# Patient Record
Sex: Female | Born: 1960 | Race: White | Hispanic: No | Marital: Single | Smoking: Never smoker
Health system: Southern US, Community
[De-identification: ages and names within clinical notes are randomized; demographics above are authoritative.]

## PROBLEM LIST (undated history)

## (undated) HISTORY — PX: ABDOMINAL HYSTERECTOMY: SHX81

---

## 1998-08-09 ENCOUNTER — Other Ambulatory Visit: Admission: RE | Admit: 1998-08-09 | Discharge: 1998-08-09 | Payer: Self-pay | Admitting: Obstetrics and Gynecology

## 1998-09-04 ENCOUNTER — Other Ambulatory Visit: Admission: RE | Admit: 1998-09-04 | Discharge: 1998-09-04 | Payer: Self-pay | Admitting: Obstetrics and Gynecology

## 1998-10-11 ENCOUNTER — Other Ambulatory Visit: Admission: RE | Admit: 1998-10-11 | Discharge: 1998-10-11 | Payer: Self-pay | Admitting: *Deleted

## 1999-02-05 ENCOUNTER — Other Ambulatory Visit: Admission: RE | Admit: 1999-02-05 | Discharge: 1999-02-05 | Payer: Self-pay | Admitting: Obstetrics and Gynecology

## 1999-06-23 ENCOUNTER — Encounter (INDEPENDENT_AMBULATORY_CARE_PROVIDER_SITE_OTHER): Payer: Self-pay

## 1999-06-24 ENCOUNTER — Inpatient Hospital Stay (HOSPITAL_COMMUNITY): Admission: EM | Admit: 1999-06-24 | Discharge: 1999-06-25 | Payer: Self-pay | Admitting: Obstetrics and Gynecology

## 2002-06-22 ENCOUNTER — Other Ambulatory Visit: Admission: RE | Admit: 2002-06-22 | Discharge: 2002-06-22 | Payer: Self-pay | Admitting: Obstetrics and Gynecology

## 2004-03-27 ENCOUNTER — Other Ambulatory Visit: Admission: RE | Admit: 2004-03-27 | Discharge: 2004-03-27 | Payer: Self-pay | Admitting: Obstetrics and Gynecology

## 2004-04-01 ENCOUNTER — Encounter: Admission: RE | Admit: 2004-04-01 | Discharge: 2004-04-01 | Payer: Self-pay | Admitting: Obstetrics and Gynecology

## 2004-09-11 ENCOUNTER — Encounter: Admission: RE | Admit: 2004-09-11 | Discharge: 2004-09-11 | Payer: Self-pay | Admitting: Obstetrics and Gynecology

## 2004-11-11 ENCOUNTER — Encounter: Admission: RE | Admit: 2004-11-11 | Discharge: 2004-11-11 | Payer: Self-pay | Admitting: Family Medicine

## 2005-04-09 ENCOUNTER — Ambulatory Visit (HOSPITAL_COMMUNITY): Admission: RE | Admit: 2005-04-09 | Discharge: 2005-04-09 | Payer: Self-pay | Admitting: Family Medicine

## 2005-07-02 ENCOUNTER — Encounter: Admission: RE | Admit: 2005-07-02 | Discharge: 2005-07-02 | Payer: Self-pay | Admitting: Family Medicine

## 2007-01-06 ENCOUNTER — Encounter: Admission: RE | Admit: 2007-01-06 | Discharge: 2007-01-06 | Payer: Self-pay | Admitting: Family Medicine

## 2008-05-08 ENCOUNTER — Encounter: Admission: RE | Admit: 2008-05-08 | Discharge: 2008-05-08 | Payer: Self-pay | Admitting: Family Medicine

## 2008-10-03 ENCOUNTER — Encounter: Admission: RE | Admit: 2008-10-03 | Discharge: 2008-10-03 | Payer: Self-pay | Admitting: Family Medicine

## 2009-02-06 ENCOUNTER — Encounter: Admission: RE | Admit: 2009-02-06 | Discharge: 2009-02-06 | Payer: Self-pay | Admitting: Orthopedic Surgery

## 2009-06-06 ENCOUNTER — Encounter: Admission: RE | Admit: 2009-06-06 | Discharge: 2009-06-06 | Payer: Self-pay | Admitting: Internal Medicine

## 2009-06-18 ENCOUNTER — Encounter: Admission: RE | Admit: 2009-06-18 | Discharge: 2009-06-18 | Payer: Self-pay | Admitting: Internal Medicine

## 2010-02-11 ENCOUNTER — Encounter: Admission: RE | Admit: 2010-02-11 | Discharge: 2010-02-11 | Payer: Self-pay | Admitting: Otolaryngology

## 2010-10-12 ENCOUNTER — Encounter: Payer: Self-pay | Admitting: Family Medicine

## 2011-06-18 ENCOUNTER — Other Ambulatory Visit: Payer: Self-pay | Admitting: Internal Medicine

## 2011-06-18 DIAGNOSIS — Z1231 Encounter for screening mammogram for malignant neoplasm of breast: Secondary | ICD-10-CM

## 2011-07-08 ENCOUNTER — Ambulatory Visit
Admission: RE | Admit: 2011-07-08 | Discharge: 2011-07-08 | Disposition: A | Discharge status: Home / Self Care | Payer: Self-pay | Source: Ambulatory Visit | Attending: Internal Medicine | Admitting: Internal Medicine

## 2011-07-08 DIAGNOSIS — Z1231 Encounter for screening mammogram for malignant neoplasm of breast: Secondary | ICD-10-CM

## 2013-04-07 ENCOUNTER — Other Ambulatory Visit: Payer: Self-pay

## 2013-04-07 DIAGNOSIS — Z1231 Encounter for screening mammogram for malignant neoplasm of breast: Secondary | ICD-10-CM

## 2013-04-24 ENCOUNTER — Ambulatory Visit
Admission: RE | Admit: 2013-04-24 | Discharge: 2013-04-24 | Disposition: A | Discharge status: Home / Self Care | Payer: BC Managed Care – PPO | Source: Ambulatory Visit

## 2013-04-24 DIAGNOSIS — Z1231 Encounter for screening mammogram for malignant neoplasm of breast: Secondary | ICD-10-CM

## 2013-08-27 ENCOUNTER — Emergency Department (HOSPITAL_COMMUNITY)
Admission: EM | Admit: 2013-08-27 | Discharge: 2013-08-27 | Disposition: A | Discharge status: Home / Self Care | Payer: BC Managed Care – PPO | Source: Home / Self Care | Attending: Family Medicine | Admitting: Family Medicine

## 2013-08-27 ENCOUNTER — Emergency Department (INDEPENDENT_AMBULATORY_CARE_PROVIDER_SITE_OTHER): Payer: BC Managed Care – PPO

## 2013-08-27 ENCOUNTER — Encounter (HOSPITAL_COMMUNITY): Payer: Self-pay | Admitting: Emergency Medicine

## 2013-08-27 DIAGNOSIS — R0789 Other chest pain: Secondary | ICD-10-CM

## 2013-08-27 NOTE — ED Notes (Signed)
Pt here with c/o sudden right ribcage pain radiating to back that started yesterday. No injury or strain noted Chest tightness reported with sx's Pt tried Gas-X

## 2013-08-27 NOTE — ED Provider Notes (Signed)
CSN: [id number]     Arrival date & time 08/27/13  1746 History   First MD Initiated Contact with Patient 08/27/13 1802     Chief Complaint  Patient presents with  . Chest Pain   (Consider location/radiation/quality/duration/timing/severity/associated sxs/prior Treatment) Patient is a 52 y.o. female presenting with chest pain. The history is provided by the patient.  Chest Pain Pain location:  R chest Pain quality: sharp and shooting   Pain quality: no pressure   Pain radiates to:  Mid back Pain radiates to the back: yes   Pain severity:  Mild Progression:  Unchanged Chronicity:  New Context: not eating, not lifting, no movement and not raising an arm   Associated symptoms: no abdominal pain, no anorexia, no cough, no fever, no nausea, no palpitations and no weakness     History reviewed. No pertinent past medical history. Past Surgical History  Procedure Laterality Date  . Abdominal hysterectomy     No family history on file. History  Substance Use Topics  . Smoking status: Never Smoker   . Smokeless tobacco: Not on file  . Alcohol Use: Yes     Comment: admits to drinking daily   OB History   Grav Para Term Preterm Abortions TAB SAB Ect Mult Living                 Review of Systems  Constitutional: Negative.  Negative for fever.  Respiratory: Negative for cough.   Cardiovascular: Positive for chest pain. Negative for palpitations and leg swelling.  Gastrointestinal: Negative.  Negative for nausea, abdominal pain and anorexia.  Neurological: Negative for weakness.  Hematological: Negative for adenopathy.    Allergies  Sulfur and Tetracyclines & related  Home Medications   Current Outpatient Rx  Name  Route  Sig  Dispense  Refill  . ALPRAZolam (XANAX) 0.5 MG tablet   Oral   Take 0.5 mg by mouth at bedtime as needed for anxiety.         Marland Kitchen zolpidem (AMBIEN) 10 MG tablet   Oral   Take 10 mg by mouth at bedtime as needed for sleep.          BP 120/60   Pulse 93  Temp(Src) 97.9 F (36.6 C) (Oral)  Wt 122 lb (55.339 kg)  SpO2 100% Physical Exam  Nursing note and vitals reviewed. Constitutional: She is oriented to person, place, and time. She appears well-developed and well-nourished.  Neck: Normal range of motion. Neck supple.  Cardiovascular: Regular rhythm, normal heart sounds and intact distal pulses.   Pulmonary/Chest: Effort normal and breath sounds normal. She exhibits no tenderness.  Abdominal: Soft. Bowel sounds are normal.  Lymphadenopathy:    She has no cervical adenopathy.  Neurological: She is alert and oriented to person, place, and time.  Skin: Skin is warm and dry.    ED Course  Procedures (including critical care time) Labs Review Labs Reviewed - No data to display Imaging Review No results found.  EKG Interpretation    Date/Time:    Ventricular Rate:    PR Interval:    QRS Duration:   QT Interval:    QTC Calculation:   R Axis:     Text Interpretation:              MDM  X-rays reviewed and report per radiologist.     Linna Hoff, MD 08/27/13 1840

## 2013-08-29 ENCOUNTER — Ambulatory Visit (HOSPITAL_COMMUNITY): Payer: BC Managed Care – PPO

## 2015-02-04 ENCOUNTER — Ambulatory Visit
Admission: RE | Admit: 2015-02-04 | Discharge: 2015-02-04 | Disposition: A | Discharge status: Home / Self Care | Payer: Disability Insurance | Source: Ambulatory Visit | Attending: Internal Medicine | Admitting: Internal Medicine

## 2015-02-04 ENCOUNTER — Ambulatory Visit
Admission: RE | Admit: 2015-02-04 | Discharge: 2015-02-04 | Disposition: A | Discharge status: Home / Self Care | Payer: Disability Insurance | Source: Ambulatory Visit | Attending: Advanced Practice Midwife | Admitting: Advanced Practice Midwife

## 2015-02-04 ENCOUNTER — Other Ambulatory Visit: Payer: Self-pay | Admitting: Internal Medicine

## 2015-02-04 DIAGNOSIS — M549 Dorsalgia, unspecified: Secondary | ICD-10-CM

## 2015-02-04 DIAGNOSIS — M25559 Pain in unspecified hip: Secondary | ICD-10-CM | POA: Insufficient documentation

## 2015-02-04 DIAGNOSIS — R200 Anesthesia of skin: Secondary | ICD-10-CM | POA: Insufficient documentation

## 2015-05-28 ENCOUNTER — Other Ambulatory Visit: Payer: Self-pay

## 2015-05-28 DIAGNOSIS — Z1231 Encounter for screening mammogram for malignant neoplasm of breast: Secondary | ICD-10-CM

## 2015-05-30 ENCOUNTER — Ambulatory Visit
Admission: RE | Admit: 2015-05-30 | Discharge: 2015-05-30 | Disposition: A | Discharge status: Home / Self Care | Payer: 59 | Source: Ambulatory Visit

## 2015-05-30 DIAGNOSIS — Z1231 Encounter for screening mammogram for malignant neoplasm of breast: Secondary | ICD-10-CM

## 2015-05-31 ENCOUNTER — Ambulatory Visit: Payer: Disability Insurance

## 2015-06-03 ENCOUNTER — Other Ambulatory Visit: Payer: Self-pay | Admitting: Internal Medicine

## 2015-06-03 DIAGNOSIS — R928 Other abnormal and inconclusive findings on diagnostic imaging of breast: Secondary | ICD-10-CM

## 2015-06-07 ENCOUNTER — Ambulatory Visit
Admission: RE | Admit: 2015-06-07 | Discharge: 2015-06-07 | Disposition: A | Discharge status: Home / Self Care | Payer: 59 | Source: Ambulatory Visit | Attending: Internal Medicine | Admitting: Internal Medicine

## 2015-06-07 DIAGNOSIS — R928 Other abnormal and inconclusive findings on diagnostic imaging of breast: Secondary | ICD-10-CM

## 2016-09-08 ENCOUNTER — Other Ambulatory Visit: Payer: Self-pay | Admitting: Internal Medicine

## 2016-09-08 DIAGNOSIS — Z1231 Encounter for screening mammogram for malignant neoplasm of breast: Secondary | ICD-10-CM

## 2017-07-19 ENCOUNTER — Other Ambulatory Visit: Payer: Self-pay | Admitting: Family Medicine

## 2017-07-19 DIAGNOSIS — Z139 Encounter for screening, unspecified: Secondary | ICD-10-CM

## 2017-08-11 ENCOUNTER — Ambulatory Visit: Payer: Disability Insurance

## 2017-09-03 ENCOUNTER — Ambulatory Visit
Admission: RE | Admit: 2017-09-03 | Discharge: 2017-09-03 | Disposition: A | Discharge status: Home / Self Care | Payer: BLUE CROSS/BLUE SHIELD | Source: Ambulatory Visit | Attending: Family Medicine | Admitting: Family Medicine

## 2017-09-03 DIAGNOSIS — Z139 Encounter for screening, unspecified: Secondary | ICD-10-CM

## 2020-01-02 ENCOUNTER — Other Ambulatory Visit: Payer: Self-pay | Admitting: Family Medicine

## 2020-01-02 DIAGNOSIS — Z1231 Encounter for screening mammogram for malignant neoplasm of breast: Secondary | ICD-10-CM

## 2020-02-01 LAB — EXTERNAL GENERIC LAB PROCEDURE: COLOGUARD: NEGATIVE

## 2020-02-14 ENCOUNTER — Other Ambulatory Visit: Payer: Self-pay

## 2020-02-14 ENCOUNTER — Ambulatory Visit
Admission: RE | Admit: 2020-02-14 | Discharge: 2020-02-14 | Disposition: A | Discharge status: Home / Self Care | Payer: Medicare PPO | Source: Ambulatory Visit | Attending: Family Medicine | Admitting: Family Medicine

## 2020-02-14 DIAGNOSIS — Z1231 Encounter for screening mammogram for malignant neoplasm of breast: Secondary | ICD-10-CM

## 2020-03-19 DIAGNOSIS — E7029 Other disorders of tyrosine metabolism: Secondary | ICD-10-CM | POA: Diagnosis not present

## 2020-03-19 DIAGNOSIS — D485 Neoplasm of uncertain behavior of skin: Secondary | ICD-10-CM | POA: Diagnosis not present

## 2020-03-19 DIAGNOSIS — L821 Other seborrheic keratosis: Secondary | ICD-10-CM | POA: Diagnosis not present

## 2020-03-19 DIAGNOSIS — D1801 Hemangioma of skin and subcutaneous tissue: Secondary | ICD-10-CM | POA: Diagnosis not present

## 2021-04-01 ENCOUNTER — Other Ambulatory Visit: Payer: Self-pay | Admitting: Family Medicine

## 2021-04-01 DIAGNOSIS — Z1231 Encounter for screening mammogram for malignant neoplasm of breast: Secondary | ICD-10-CM

## 2021-04-02 ENCOUNTER — Other Ambulatory Visit: Payer: Self-pay | Admitting: Family Medicine

## 2021-04-02 DIAGNOSIS — R918 Other nonspecific abnormal finding of lung field: Secondary | ICD-10-CM

## 2021-04-09 ENCOUNTER — Ambulatory Visit
Admission: RE | Admit: 2021-04-09 | Discharge: 2021-04-09 | Disposition: A | Discharge status: Home / Self Care | Payer: Medicare Other | Source: Ambulatory Visit | Attending: Family Medicine | Admitting: Family Medicine

## 2021-04-09 DIAGNOSIS — R918 Other nonspecific abnormal finding of lung field: Secondary | ICD-10-CM

## 2021-04-09 MED ORDER — IOPAMIDOL (ISOVUE-300) INJECTION 61%
75.0000 mL | Freq: Once | INTRAVENOUS | Status: AC | PRN
  Administered 2021-04-09: 75 mL via INTRAVENOUS

## 2021-05-23 ENCOUNTER — Ambulatory Visit
Admission: RE | Admit: 2021-05-23 | Discharge: 2021-05-23 | Disposition: A | Discharge status: Home / Self Care | Payer: Medicare Other | Source: Ambulatory Visit | Attending: Family Medicine | Admitting: Family Medicine

## 2021-05-23 ENCOUNTER — Other Ambulatory Visit: Payer: Self-pay

## 2021-05-23 DIAGNOSIS — Z1231 Encounter for screening mammogram for malignant neoplasm of breast: Secondary | ICD-10-CM

## 2021-11-01 IMAGING — MG MM DIGITAL SCREENING BILAT W/ TOMO AND CAD
8 series · 8 of 24 positions shown · non-contrast
Comparison: Previous exam(s).

CLINICAL DATA: Screening.

EXAM:
DIGITAL SCREENING BILATERAL MAMMOGRAM WITH TOMOSYNTHESIS AND CAD
TECHNIQUE: Bilateral screening digital craniocaudal and mediolateral oblique
mammograms were obtained. Bilateral screening digital breast
tomosynthesis was performed. The images were evaluated with
computer-aided detection.

[R CC synth-2D]
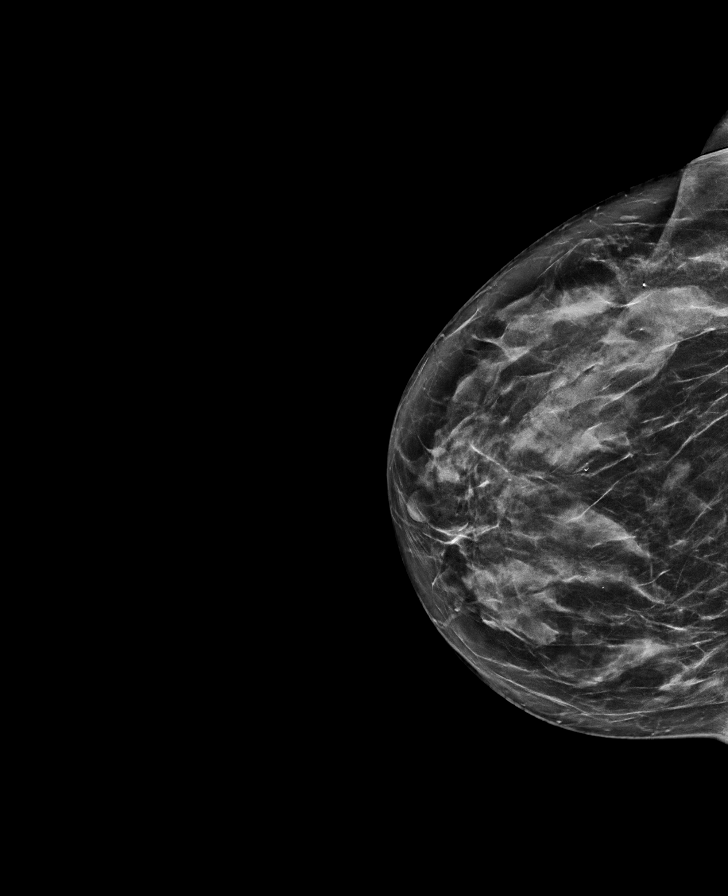

[R MLO synth-2D]
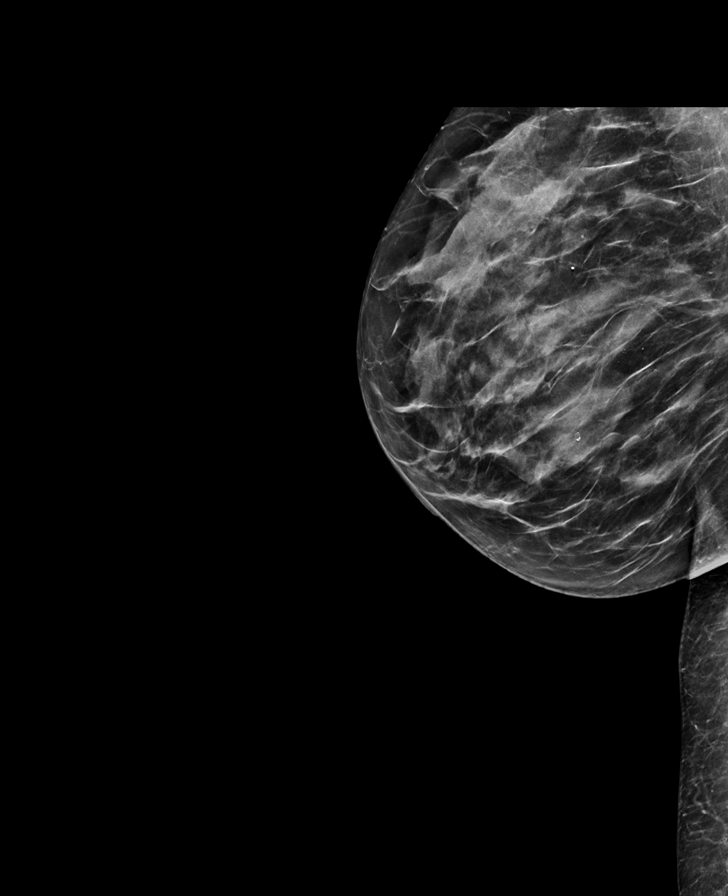

[L CC synth-2D]
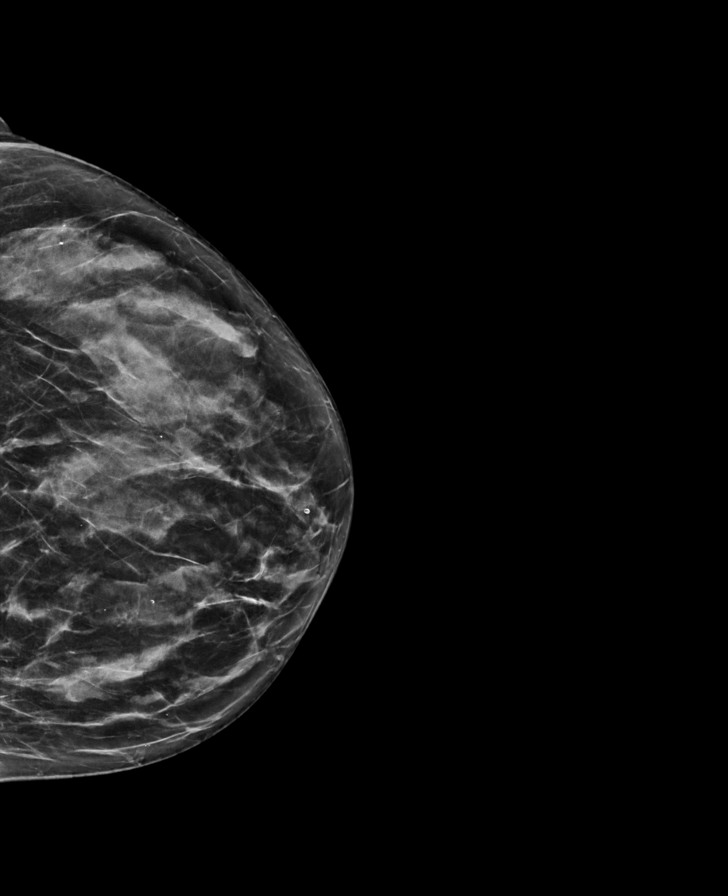

[L MLO synth-2D]
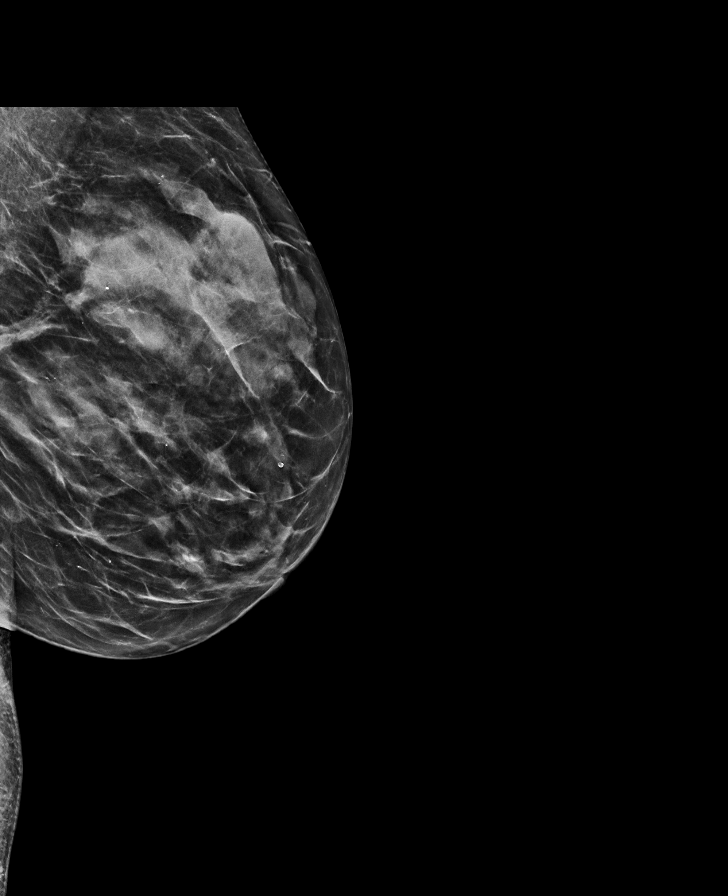

[L CC tomo · tomo slice 31/61.0]
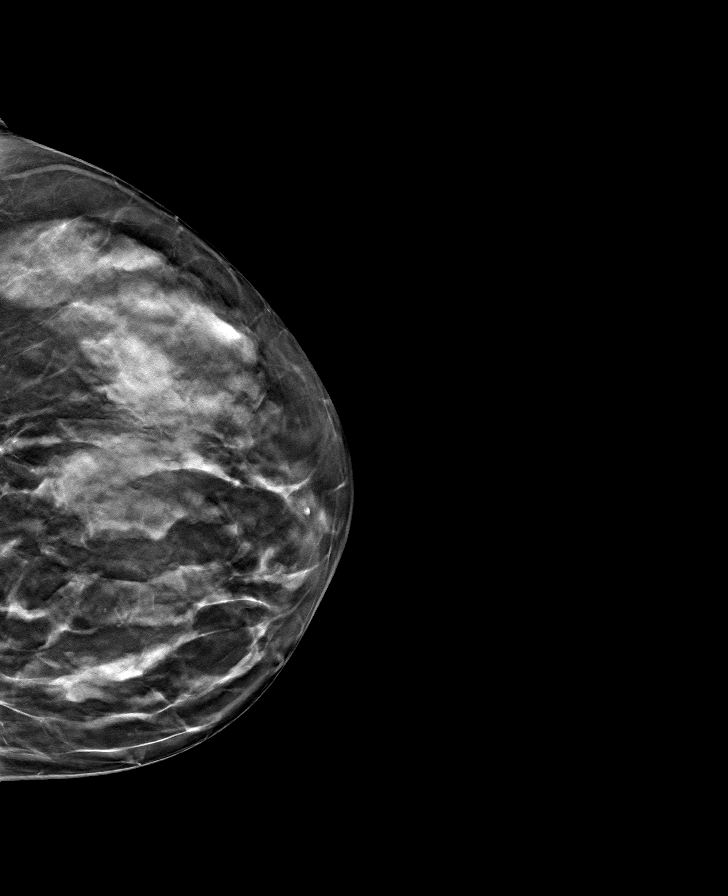

[L MLO tomo · tomo slice 31/60.0]
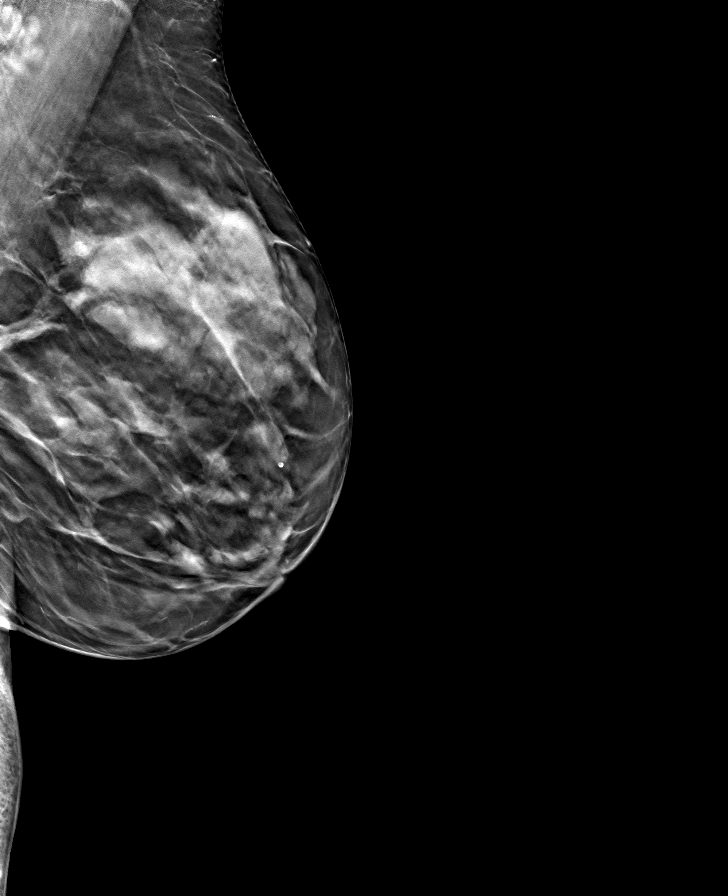

[R MLO tomo · tomo slice 30/59.0]
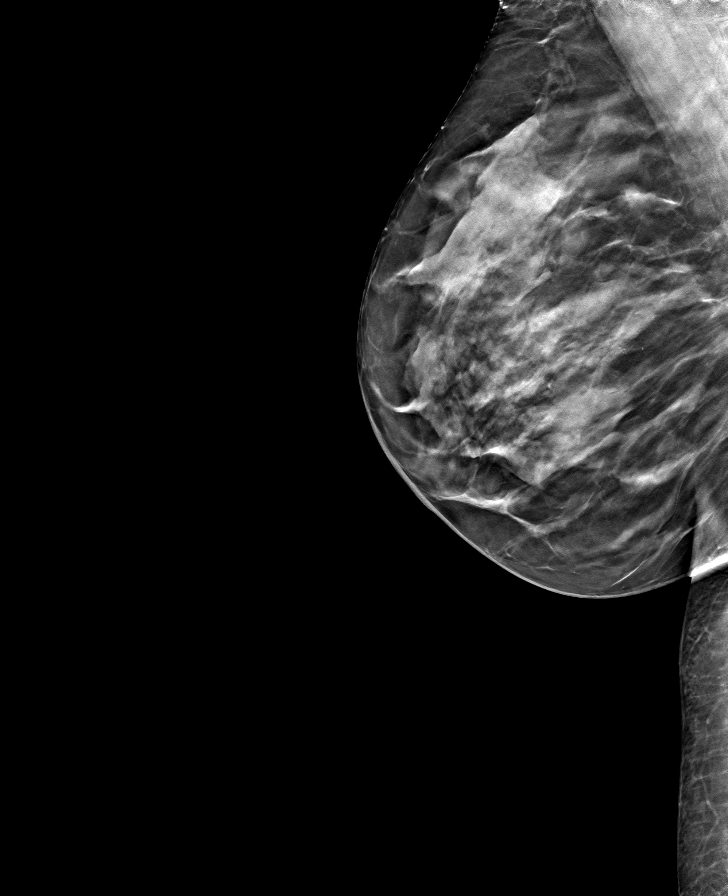

[R CC tomo · tomo slice 33/64.0]
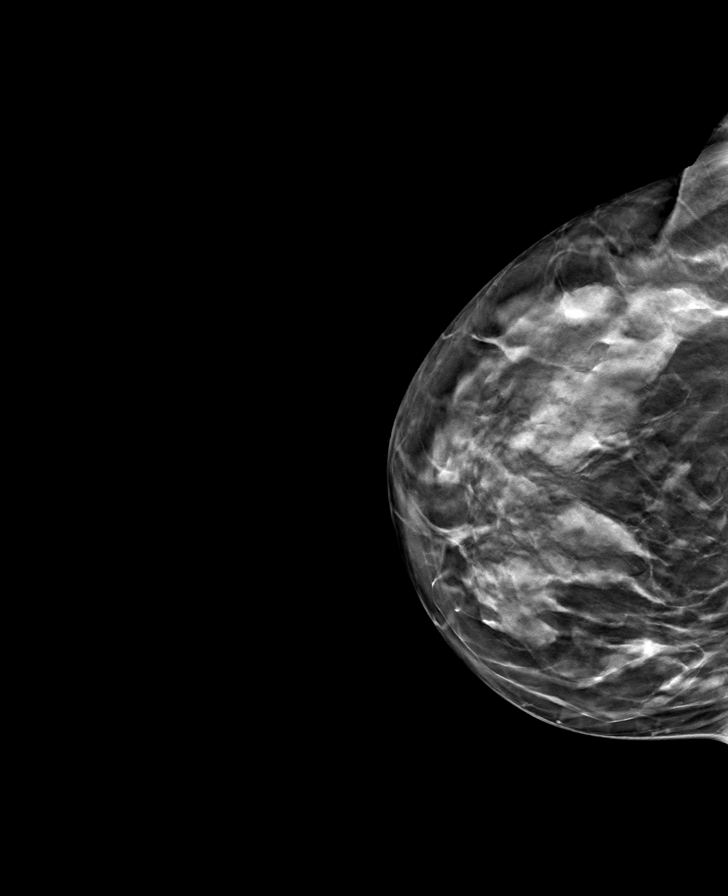

[8 of 24 positions shown; findings below may reference images not displayed]

ACR Breast Density Category c: The breast tissue is heterogeneously
dense, which may obscure small masses.
FINDINGS: There are no findings suspicious for malignancy.
IMPRESSION: No mammographic evidence of malignancy. A result letter of this
screening mammogram will be mailed directly to the patient.

RECOMMENDATION:
Screening mammogram in one year. (Code:Q3-W-BC3)

BI-RADS CATEGORY  1: Negative.

## 2022-01-18 ENCOUNTER — Other Ambulatory Visit: Payer: Self-pay

## 2022-01-18 ENCOUNTER — Emergency Department (HOSPITAL_COMMUNITY)
Admission: EM | Admit: 2022-01-18 | Discharge: 2022-01-18 | Disposition: A | Discharge status: Home / Self Care | Payer: Medicare Other | Attending: Emergency Medicine | Admitting: Emergency Medicine

## 2022-01-18 ENCOUNTER — Emergency Department (HOSPITAL_COMMUNITY): Payer: Medicare Other

## 2022-01-18 ENCOUNTER — Encounter (HOSPITAL_COMMUNITY): Payer: Self-pay | Admitting: Oncology

## 2022-01-18 DIAGNOSIS — R0602 Shortness of breath: Secondary | ICD-10-CM | POA: Diagnosis not present

## 2022-01-18 DIAGNOSIS — R61 Generalized hyperhidrosis: Secondary | ICD-10-CM | POA: Insufficient documentation

## 2022-01-18 DIAGNOSIS — R079 Chest pain, unspecified: Secondary | ICD-10-CM | POA: Insufficient documentation

## 2022-01-18 DIAGNOSIS — R0789 Other chest pain: Secondary | ICD-10-CM | POA: Diagnosis not present

## 2022-01-18 LAB — I-STAT BETA HCG BLOOD, ED (MC, WL, AP ONLY): I-stat hCG, quantitative: <5 m[IU]/mL (ref <5)

## 2022-01-18 LAB — CBC
HCT: 41.8 % (ref 36.0–46.0)
Hemoglobin: 14.2 g/dL (ref 12.0–15.0)
MCH: 30 pg (ref 26.0–34.0)
MCHC: 34 g/dL (ref 30.0–36.0)
MCV: 88.2 fL (ref 80.0–100.0)
Platelets: 252 10*3/uL (ref 150–400)
RBC: 4.74 MIL/uL (ref 3.87–5.11)
RDW: 12.4 % (ref 11.5–15.5)
WBC: 5.7 10*3/uL (ref 4.0–10.5)
nRBC: 0 % (ref 0.0–0.2)

## 2022-01-18 LAB — BASIC METABOLIC PANEL
Anion gap: 8 (ref 5–15)
BUN: 20 mg/dL (ref 6–20)
CO2: 26 mmol/L (ref 22–32)
Calcium: 9.6 mg/dL (ref 8.9–10.3)
Chloride: 104 mmol/L (ref 98–111)
Creatinine, Ser: 0.74 mg/dL (ref 0.44–1.00)
GFR, Estimated: >60 mL/min (ref >60)
Glucose, Bld: 105 mg/dL — ABNORMAL HIGH (ref 70–99)
Potassium: 3.8 mmol/L (ref 3.5–5.1)
Sodium: 138 mmol/L (ref 135–145)

## 2022-01-18 LAB — TROPONIN I (HIGH SENSITIVITY): Troponin I (High Sensitivity): 2 ng/L (ref <18)

## 2022-01-18 NOTE — ED Provider Notes (Signed)
?[facility] ?Provider Note ? ? ?CSN: [id number] ?Arrival date & time: 01/18/22  1652 ? ?  ? ?History ? ?Chief Complaint  ?Patient presents with  ? Chest Pain  ? ? ?Julie Gomez is a 61 y.o. female. ? ?Patient is a 61 yo female with PMH of alkaptonuria dx in 2014 presenting for chest pain. Pt admits to left sided chest pain that started this morning, non radiating, pressure like, constant, and associated with diaphoresis. States she has had intermittent chest pain over the last few months while resting. Was drinking coffee and having breakfast at tables when symptoms started. Pain lasted approximately 8 hours. Denies any sob, dib, or coughing. Admits to complete resolution of symptoms at this time.  ? ?No HTN, hyperlipidemia, nonsmoker, or DM.  ? ?The history is provided by the patient. No language interpreter was used.  ?Chest Pain ?Associated symptoms: no abdominal pain, no back pain, no cough, no fever, no palpitations, no shortness of breath and no vomiting   ? ?  ? ?Home Medications ?Prior to Admission medications   ?Medication Sig Start Date End Date Taking? Authorizing Provider  ?ALPRAZolam (XANAX) 0.5 MG tablet Take 0.5 mg by mouth at bedtime as needed for anxiety.    [provider]  ?zolpidem (AMBIEN) 10 MG tablet Take 10 mg by mouth at bedtime as needed for sleep.    [provider]  ?   ? ?Allergies    ?Elemental sulfur and Tetracyclines & related   ? ?Review of Systems   ?Review of Systems  ?Constitutional:  Negative for chills and fever.  ?HENT:  Negative for ear pain and sore throat.   ?Eyes:  Negative for pain and visual disturbance.  ?Respiratory:  Negative for cough and shortness of breath.   ?Cardiovascular:  Positive for chest pain. Negative for palpitations.  ?Gastrointestinal:  Negative for abdominal pain and vomiting.  ?Genitourinary:  Negative for dysuria and hematuria.  ?Musculoskeletal:  Negative for arthralgias and back pain.  ?Skin:   Negative for color change and rash.  ?Neurological:  Negative for seizures and syncope.  ?All other systems reviewed and are negative. ? ?Physical Exam ?Updated Vital Signs ?BP 126/83   Pulse 84   Temp 98.7 ?F (37.1 ?C) (Oral)   Resp 20   Ht '5\' 1"'$  (1.549 m)   Wt 60.8 kg   SpO2 97%   BMI 25.32 kg/m?  ?Physical Exam ?Vitals and nursing note reviewed.  ?Constitutional:   ?   General: She is not in acute distress. ?   Appearance: She is well-developed.  ?HENT:  ?   Head: Normocephalic and atraumatic.  ?Eyes:  ?   Conjunctiva/sclera: Conjunctivae normal.  ?Cardiovascular:  ?   Rate and Rhythm: Normal rate and regular rhythm.  ?   Heart sounds: No murmur heard. ?Pulmonary:  ?   Effort: Pulmonary effort is normal. No respiratory distress.  ?   Breath sounds: Normal breath sounds.  ?Abdominal:  ?   Palpations: Abdomen is soft.  ?   Tenderness: There is no abdominal tenderness.  ?Musculoskeletal:     ?   General: No swelling.  ?   Cervical back: Neck supple.  ?Skin: ?   General: Skin is warm and dry.  ?   Capillary Refill: Capillary refill takes less than 2 seconds.  ?Neurological:  ?   Mental Status: She is alert.  ?Psychiatric:     ?   Mood and Affect: Mood normal.  ? ? ?ED  Results / Procedures / Treatments   ?Labs ?(all labs ordered are listed, but only abnormal results are displayed) ?Labs Reviewed  ?BASIC METABOLIC PANEL - Abnormal; Notable for the following components:  ?    Result Value  ? Glucose, Bld 105 (*)   ? All other components within normal limits  ?CBC  ?I-STAT BETA HCG BLOOD, ED ([facility], [facility], [facility] ONLY)  ?TROPONIN I (HIGH SENSITIVITY)  ? ? ?EKG ?EKG Interpretation ? ?Date/Time:  Sunday January 18 2022 17:12:10 EDT ?Ventricular Rate:  82 ?PR Interval:  155 ?QRS Duration: 76 ?QT Interval:  364 ?QTC Calculation: 426 ?R Axis:   48 ?Text Interpretation: Sinus rhythm Low voltage, precordial leads Confirmed by Campbell Stall ([id number]) on 04/09/9469 6:25:08 PM ? ?Radiology ?DG Chest 2 View ? ?Result Date:  01/18/2022 ?CLINICAL DATA:  Shortness of breath and chest pain. EXAM: CHEST - 2 VIEW COMPARISON:  03/26/2021 FINDINGS: The heart size and mediastinal contours are within normal limits. Both lungs are clear. The visualized skeletal structures are unremarkable. IMPRESSION: No active cardiopulmonary disease. Electronically Signed   By: Kerby Moors M.D.   On: 01/18/2022 17:39   ? ?Procedures ?Procedures  ? ? ?Medications Ordered in ED ?Medications - No data to display ? ?ED Course/ Medical Decision Making/ A&P ?  ?                        ?Medical Decision Making ?Amount and/or Complexity of Data Reviewed ?Labs: ordered. ?Radiology: ordered. ? ? ?7:45 PM ? 61 yo female female presenting for chest pain. ? ?The patient's chest pain is not suggestive of pulmonary embolus, cardiac ischemia, aortic dissection, pericarditis, myocarditis, pulmonary embolism, pneumothorax, pneumonia, Zoster, or esophageal perforation, or other serious etiology.  Historically not abrupt in onset, tearing or ripping, pulses symmetric. EKG nonspecific for ischemia/infarction. No dysrhythmias, brugada, WPW, prolonged QT noted. [CXR reviewed and WNL] [Troponin negative x2. CXR reviewed. Labs without demonstration of acute pathology unless otherwise noted above. Low HEART Score: 0-3 points (0.9-1.7% risk of MACE).]  ? ?Given the extremely low risk of these diagnoses further testing and evaluation for these possibilities does not appear to be indicated at this time. Patient in no distress and overall condition improved here in the ED. Detailed discussions were had with the patient regarding current findings, and need for close f/u with PCP or on call doctor. The patient has been instructed to return immediately if the symptoms worsen in any way for re-evaluation. Patient verbalized understanding and is in agreement with current care plan. All questions answered prior to discharge.  ? ? ? ? ? ? ? ?Final Clinical Impression(s) / ED Diagnoses ?Final  diagnoses:  ?Chest pain, unspecified type  ? ? ?Rx / DC Orders ?ED Discharge Orders   ? ? None  ? ?  ? ? ?  ?Lianne Cure, DO ?96/28/36 1945 ? ?

## 2022-01-18 NOTE — ED Triage Notes (Signed)
Pt c/o left sided CP that began around 0930.  +Shob. Pt's speech is rapid and pressured.  ?

## 2022-01-28 DIAGNOSIS — M8580 Other specified disorders of bone density and structure, unspecified site: Secondary | ICD-10-CM | POA: Diagnosis not present

## 2022-01-28 DIAGNOSIS — R0789 Other chest pain: Secondary | ICD-10-CM | POA: Diagnosis not present

## 2022-01-28 DIAGNOSIS — Z0000 Encounter for general adult medical examination without abnormal findings: Secondary | ICD-10-CM | POA: Diagnosis not present

## 2022-01-28 DIAGNOSIS — E7020 Disorder of tyrosine metabolism, unspecified: Secondary | ICD-10-CM | POA: Diagnosis not present

## 2022-02-11 DIAGNOSIS — E7020 Disorder of tyrosine metabolism, unspecified: Secondary | ICD-10-CM | POA: Diagnosis not present

## 2022-02-11 DIAGNOSIS — E7029 Other disorders of tyrosine metabolism: Secondary | ICD-10-CM | POA: Diagnosis not present

## 2022-02-11 DIAGNOSIS — R0789 Other chest pain: Secondary | ICD-10-CM | POA: Diagnosis not present

## 2022-03-16 DIAGNOSIS — L814 Other melanin hyperpigmentation: Secondary | ICD-10-CM | POA: Diagnosis not present

## 2022-03-16 DIAGNOSIS — D1801 Hemangioma of skin and subcutaneous tissue: Secondary | ICD-10-CM | POA: Diagnosis not present

## 2022-03-16 DIAGNOSIS — I788 Other diseases of capillaries: Secondary | ICD-10-CM | POA: Diagnosis not present

## 2022-03-16 DIAGNOSIS — D225 Melanocytic nevi of trunk: Secondary | ICD-10-CM | POA: Diagnosis not present

## 2022-03-16 DIAGNOSIS — E7029 Other disorders of tyrosine metabolism: Secondary | ICD-10-CM | POA: Diagnosis not present

## 2022-06-29 IMAGING — CR DG CHEST 2V
2 series · 2 of 2 positions shown · non-contrast
Comparison: 03/26/2021

CLINICAL DATA: Shortness of breath and chest pain.

EXAM:
CHEST - 2 VIEW

[w chest pa]
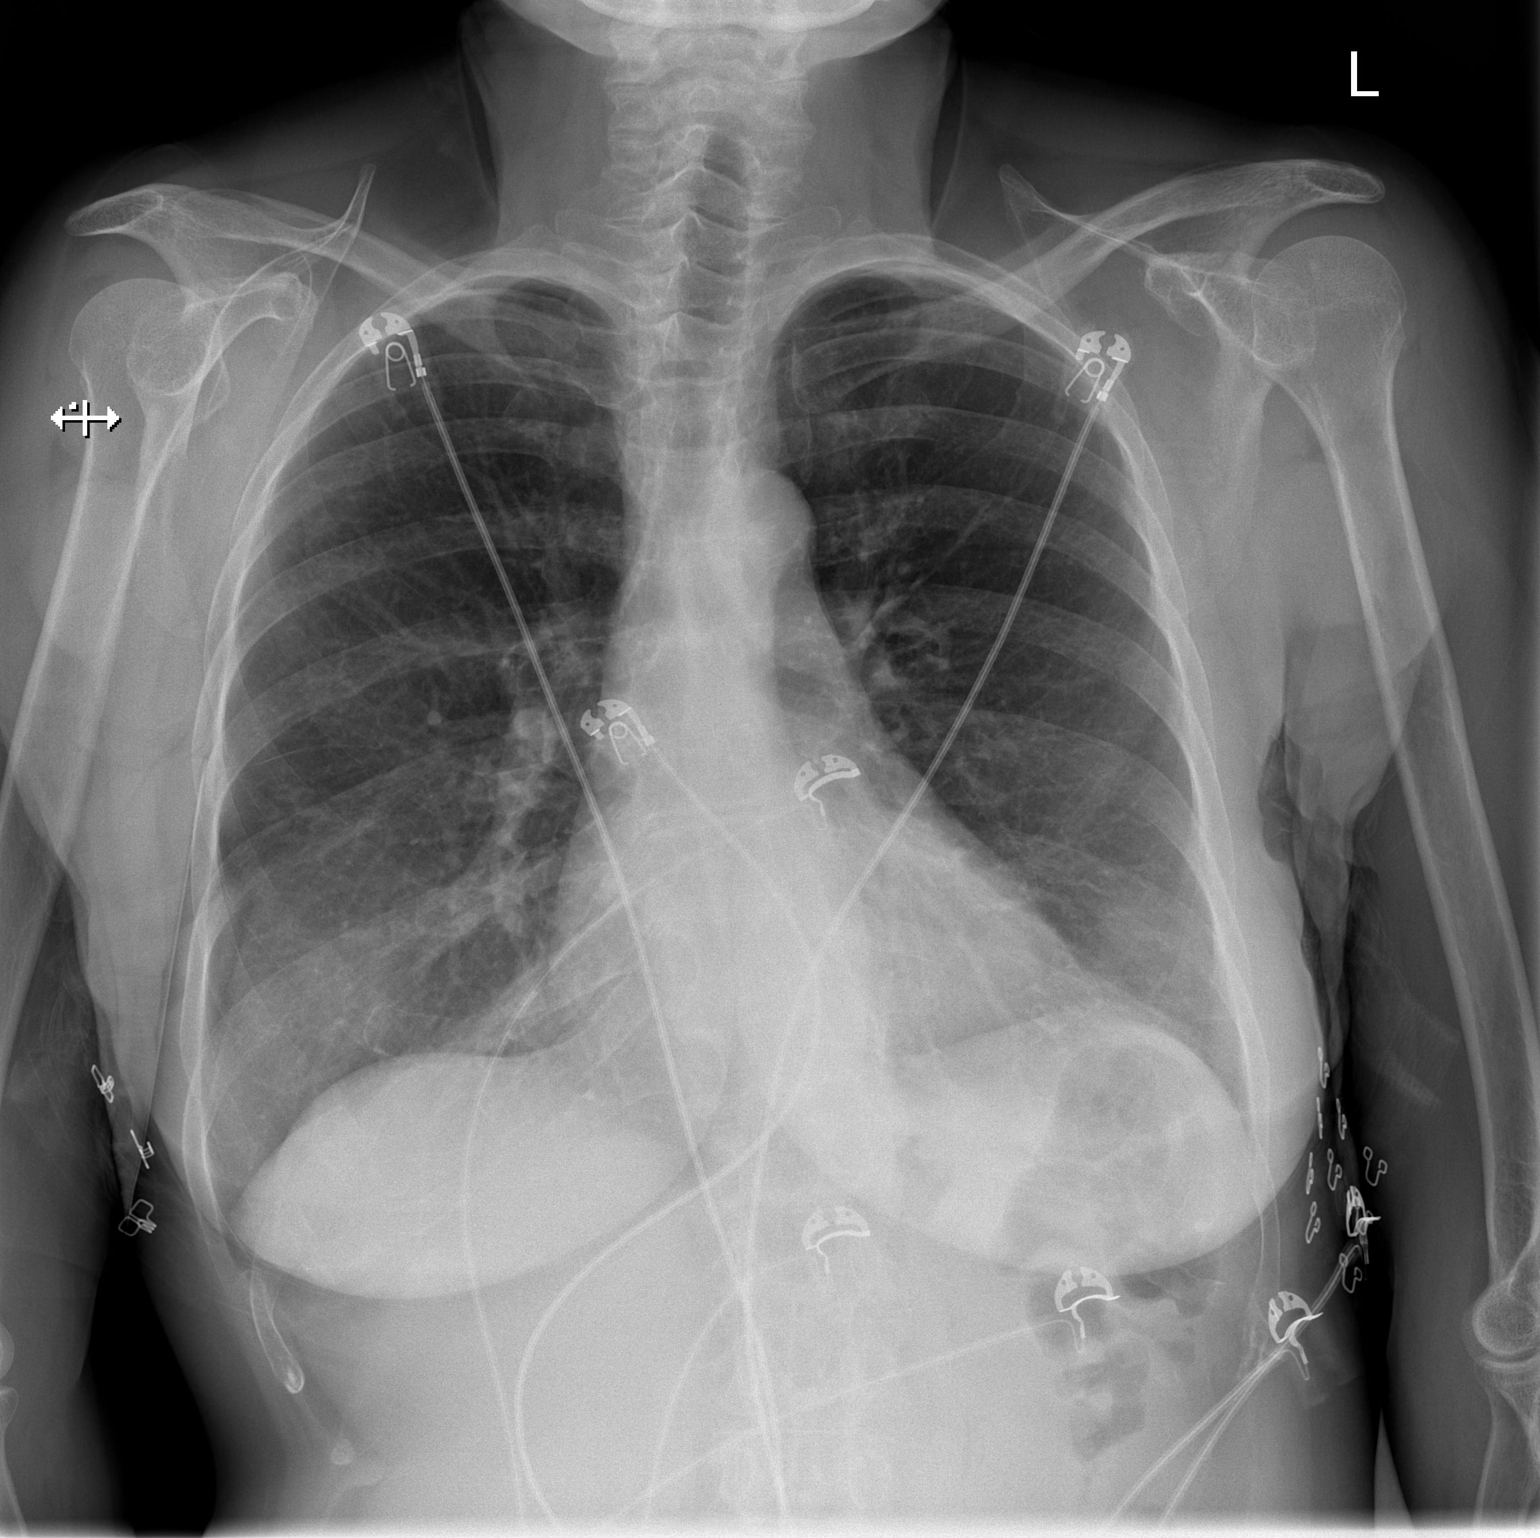

[w chest lat]
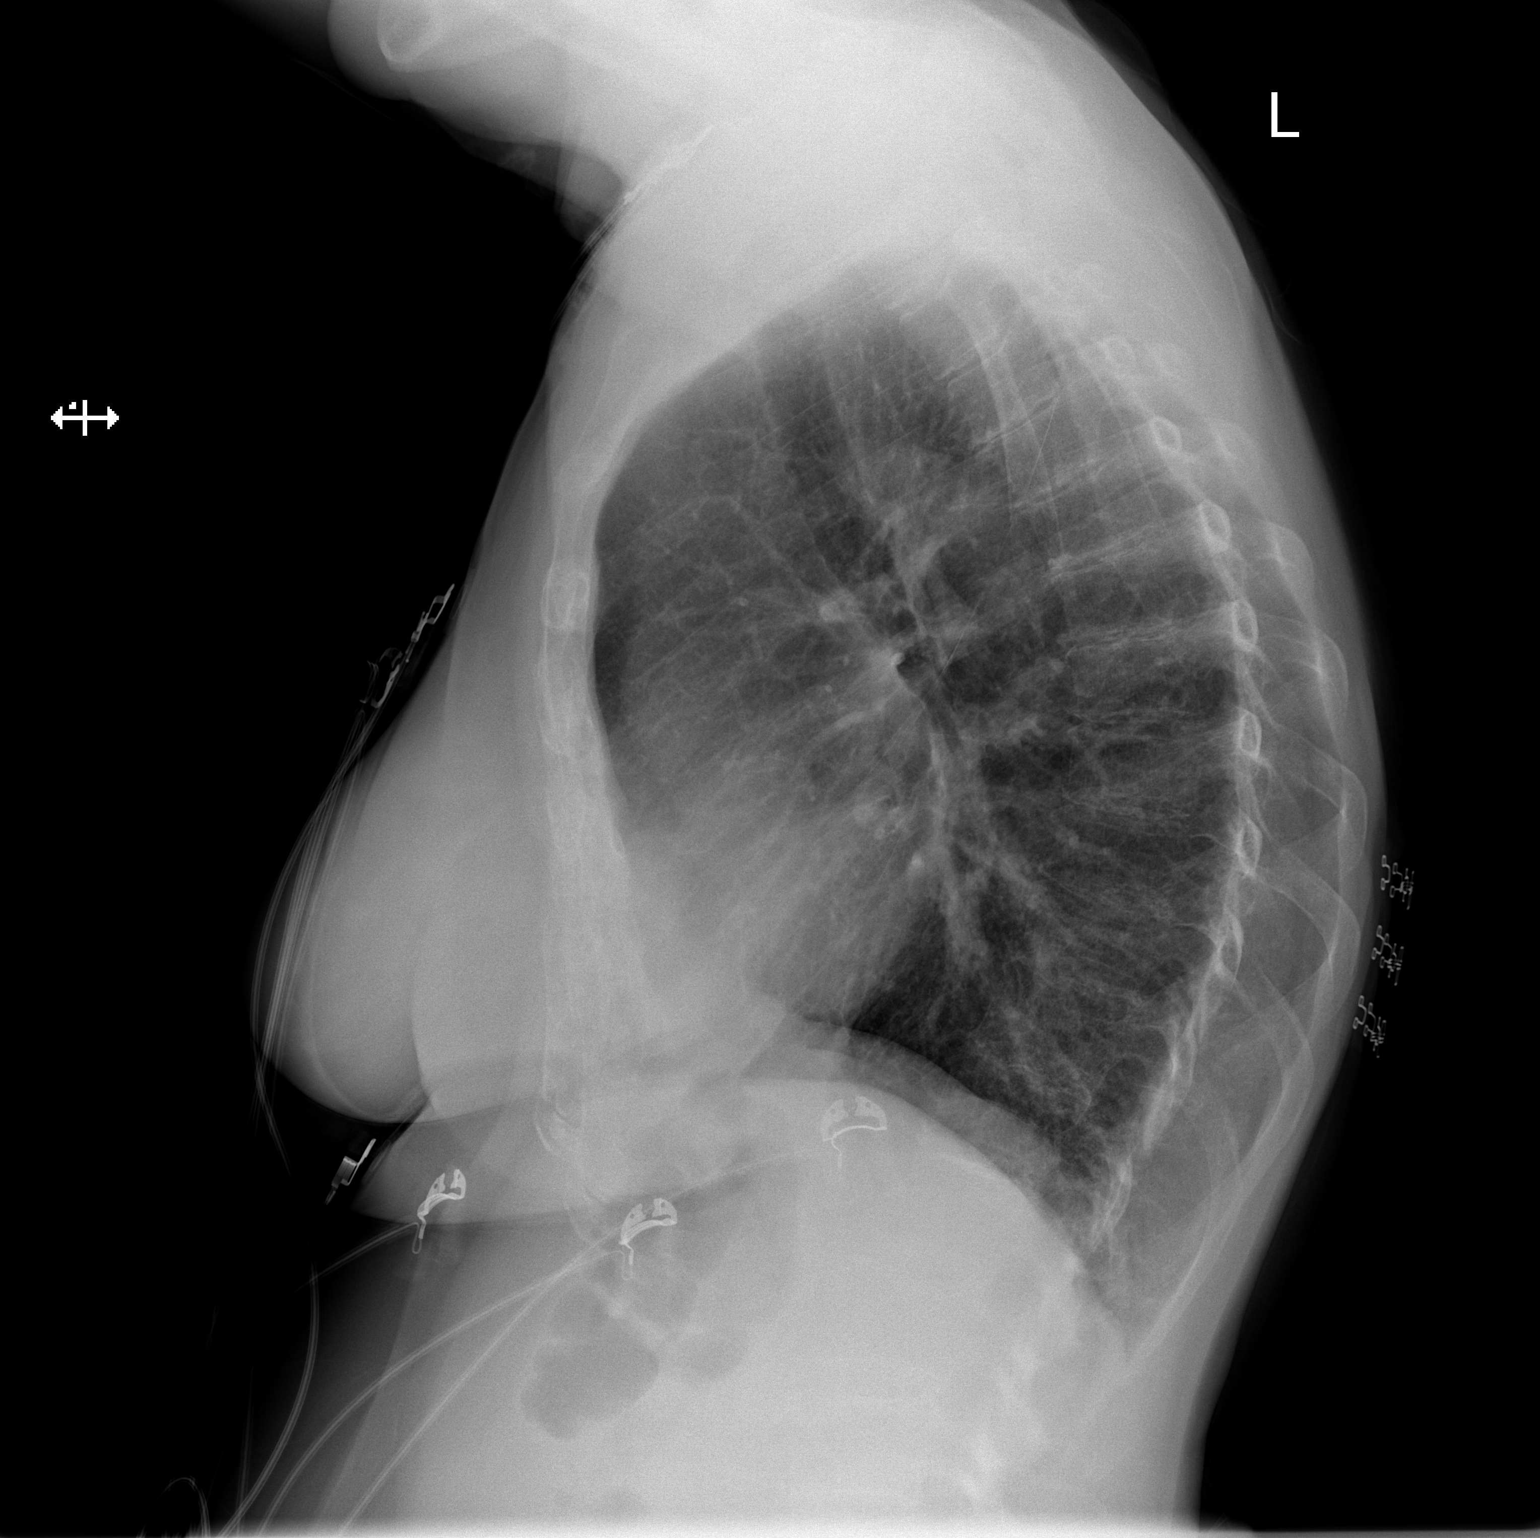

[2 of 2 positions shown; findings below may reference images not displayed]

FINDINGS: The heart size and mediastinal contours are within normal limits.
Both lungs are clear. The visualized skeletal structures are
unremarkable.
IMPRESSION: No active cardiopulmonary disease.

## 2022-10-14 ENCOUNTER — Other Ambulatory Visit: Payer: Self-pay | Admitting: Family Medicine

## 2022-10-14 DIAGNOSIS — Z1231 Encounter for screening mammogram for malignant neoplasm of breast: Secondary | ICD-10-CM

## 2022-12-04 ENCOUNTER — Ambulatory Visit
Admission: RE | Admit: 2022-12-04 | Discharge: 2022-12-04 | Disposition: A | Discharge status: Home / Self Care | Payer: Medicare Other | Source: Ambulatory Visit | Attending: Family Medicine | Admitting: Family Medicine

## 2022-12-04 DIAGNOSIS — Z1231 Encounter for screening mammogram for malignant neoplasm of breast: Secondary | ICD-10-CM

## 2023-02-03 DIAGNOSIS — Z13220 Encounter for screening for lipoid disorders: Secondary | ICD-10-CM | POA: Diagnosis not present

## 2023-02-03 DIAGNOSIS — R946 Abnormal results of thyroid function studies: Secondary | ICD-10-CM | POA: Diagnosis not present

## 2023-02-03 DIAGNOSIS — Z79899 Other long term (current) drug therapy: Secondary | ICD-10-CM | POA: Diagnosis not present

## 2023-02-03 DIAGNOSIS — R7309 Other abnormal glucose: Secondary | ICD-10-CM | POA: Diagnosis not present

## 2023-02-10 DIAGNOSIS — M810 Age-related osteoporosis without current pathological fracture: Secondary | ICD-10-CM | POA: Diagnosis not present

## 2023-02-10 DIAGNOSIS — Z13220 Encounter for screening for lipoid disorders: Secondary | ICD-10-CM | POA: Diagnosis not present

## 2023-02-10 DIAGNOSIS — Z0000 Encounter for general adult medical examination without abnormal findings: Secondary | ICD-10-CM | POA: Diagnosis not present

## 2023-02-10 DIAGNOSIS — R946 Abnormal results of thyroid function studies: Secondary | ICD-10-CM | POA: Diagnosis not present

## 2023-02-10 DIAGNOSIS — E7020 Disorder of tyrosine metabolism, unspecified: Secondary | ICD-10-CM | POA: Diagnosis not present

## 2023-02-10 DIAGNOSIS — R7309 Other abnormal glucose: Secondary | ICD-10-CM | POA: Diagnosis not present

## 2023-02-10 DIAGNOSIS — E559 Vitamin D deficiency, unspecified: Secondary | ICD-10-CM | POA: Diagnosis not present

## 2023-03-01 DIAGNOSIS — Z1211 Encounter for screening for malignant neoplasm of colon: Secondary | ICD-10-CM | POA: Diagnosis not present

## 2023-03-01 DIAGNOSIS — Z1212 Encounter for screening for malignant neoplasm of rectum: Secondary | ICD-10-CM | POA: Diagnosis not present

## 2023-03-09 LAB — EXTERNAL GENERIC LAB PROCEDURE: COLOGUARD: NEGATIVE

## 2023-05-20 DIAGNOSIS — D225 Melanocytic nevi of trunk: Secondary | ICD-10-CM | POA: Diagnosis not present

## 2023-05-20 DIAGNOSIS — D692 Other nonthrombocytopenic purpura: Secondary | ICD-10-CM | POA: Diagnosis not present

## 2023-05-20 DIAGNOSIS — D1801 Hemangioma of skin and subcutaneous tissue: Secondary | ICD-10-CM | POA: Diagnosis not present

## 2023-05-20 DIAGNOSIS — E7029 Other disorders of tyrosine metabolism: Secondary | ICD-10-CM | POA: Diagnosis not present

## 2023-05-20 DIAGNOSIS — L821 Other seborrheic keratosis: Secondary | ICD-10-CM | POA: Diagnosis not present

## 2023-05-20 DIAGNOSIS — L814 Other melanin hyperpigmentation: Secondary | ICD-10-CM | POA: Diagnosis not present

## 2023-05-20 DIAGNOSIS — L700 Acne vulgaris: Secondary | ICD-10-CM | POA: Diagnosis not present

## 2023-11-16 ENCOUNTER — Other Ambulatory Visit: Payer: Self-pay | Admitting: Family Medicine

## 2023-11-16 DIAGNOSIS — Z1231 Encounter for screening mammogram for malignant neoplasm of breast: Secondary | ICD-10-CM

## 2023-12-06 ENCOUNTER — Ambulatory Visit
Admission: RE | Admit: 2023-12-06 | Discharge: 2023-12-06 | Disposition: A | Discharge status: Home / Self Care | Payer: Medicare (Managed Care) | Source: Ambulatory Visit | Attending: Family Medicine | Admitting: Family Medicine

## 2023-12-06 DIAGNOSIS — Z1231 Encounter for screening mammogram for malignant neoplasm of breast: Secondary | ICD-10-CM

## 2024-02-16 DIAGNOSIS — Z0184 Encounter for antibody response examination: Secondary | ICD-10-CM | POA: Diagnosis not present

## 2024-03-16 DIAGNOSIS — Z23 Encounter for immunization: Secondary | ICD-10-CM | POA: Diagnosis not present
